# Patient Record
Sex: Female | Born: 2005 | Race: White | Hispanic: No | Marital: Single | Smoking: Never smoker
Health system: Southern US, Community
[De-identification: ages and names within clinical notes are randomized; demographics above are authoritative.]

---
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
## 2023-02-22 DIAGNOSIS — M25362 Other instability, left knee: Secondary | ICD-10-CM | POA: Diagnosis not present

## 2023-03-07 DIAGNOSIS — M23352 Other meniscus derangements, posterior horn of lateral meniscus, left knee: Secondary | ICD-10-CM | POA: Diagnosis not present

## 2023-03-07 DIAGNOSIS — M23612 Other spontaneous disruption of anterior cruciate ligament of left knee: Secondary | ICD-10-CM | POA: Diagnosis not present

## 2023-03-07 DIAGNOSIS — M25362 Other instability, left knee: Secondary | ICD-10-CM | POA: Diagnosis not present

## 2023-03-07 DIAGNOSIS — M84462A Pathological fracture, left tibia, initial encounter for fracture: Secondary | ICD-10-CM | POA: Diagnosis not present

## 2023-03-07 DIAGNOSIS — M94262 Chondromalacia, left knee: Secondary | ICD-10-CM | POA: Diagnosis not present

## 2023-03-07 DIAGNOSIS — M23322 Other meniscus derangements, posterior horn of medial meniscus, left knee: Secondary | ICD-10-CM | POA: Diagnosis not present

## 2023-03-07 DIAGNOSIS — M1712 Unilateral primary osteoarthritis, left knee: Secondary | ICD-10-CM | POA: Diagnosis not present

## 2023-03-07 DIAGNOSIS — M84452A Pathological fracture, left femur, initial encounter for fracture: Secondary | ICD-10-CM | POA: Diagnosis not present

## 2023-03-18 DIAGNOSIS — S83512A Sprain of anterior cruciate ligament of left knee, initial encounter: Secondary | ICD-10-CM | POA: Diagnosis not present

## 2023-03-18 DIAGNOSIS — S83282A Other tear of lateral meniscus, current injury, left knee, initial encounter: Secondary | ICD-10-CM | POA: Diagnosis not present

## 2023-03-18 DIAGNOSIS — S83242A Other tear of medial meniscus, current injury, left knee, initial encounter: Secondary | ICD-10-CM | POA: Diagnosis not present

## 2023-06-02 ENCOUNTER — Other Ambulatory Visit: Payer: Self-pay

## 2023-06-02 ENCOUNTER — Emergency Department
Admission: EM | Admit: 2023-06-02 | Discharge: 2023-06-02 | Disposition: A | Discharge status: Home / Self Care | Payer: Medicaid Other | Attending: Student | Admitting: Student

## 2023-06-02 DIAGNOSIS — J028 Acute pharyngitis due to other specified organisms: Secondary | ICD-10-CM

## 2023-06-02 DIAGNOSIS — J029 Acute pharyngitis, unspecified: Secondary | ICD-10-CM | POA: Diagnosis not present

## 2023-06-02 DIAGNOSIS — R509 Fever, unspecified: Secondary | ICD-10-CM | POA: Insufficient documentation

## 2023-06-02 DIAGNOSIS — R0981 Nasal congestion: Secondary | ICD-10-CM | POA: Diagnosis not present

## 2023-06-02 LAB — POC URINE PREG, ED: Preg Test, Ur: NEGATIVE

## 2023-06-02 MED ORDER — IBUPROFEN 600 MG PO TABS
600.0000 mg | ORAL_TABLET | Freq: Once | ORAL | Status: AC
  Administered 2023-06-02: 600 mg via ORAL
  Filled 2023-06-02: qty 1

## 2023-06-02 MED ORDER — AMOXICILLIN 500 MG PO CAPS: 500.0000 mg | ORAL_CAPSULE | Freq: Three times a day (TID) | ORAL | 0 refills | Status: AC

## 2023-06-02 MED ORDER — IBUPROFEN 600 MG PO TABS: 600.0000 mg | ORAL_TABLET | Freq: Three times a day (TID) | ORAL | 0 refills | Status: AC | PRN

## 2023-06-02 NOTE — ED Notes (Signed)
 Pt states that she has a sore throat with white spots visible on same. Pt states she had a negative strep test at [facility] today, however she wants another test performed here. Pt also states she has missed her period and is requesting a pregnancy test as well.

## 2023-06-02 NOTE — ED Provider Notes (Signed)
 [facility] Provider Note    None    (approximate)   History   Sore Throat   HPI  Rhonda Wagner is a 18 y.o. female presents today with history of 5 days of sore throat, fever, nauseas.  Patient went for urgent care without getting any treatment.     Physical Exam   Triage Vital Signs: ED Triage Vitals  Encounter Vitals Group     BP 06/02/23 1814 110/84     Systolic BP Percentile --      Diastolic BP Percentile --      Pulse Rate 06/02/23 1814 98     Resp 06/02/23 1814 18     Temp 06/02/23 1814 97.9 F (36.6 C)     Temp Source 06/02/23 1814 Oral     SpO2 06/02/23 1814 99 %     Weight --      Height --      Head Circumference --      Peak Flow --      Pain Score 06/02/23 1812 5     Pain Loc --      Pain Education --      Exclude from Growth Chart --     Most recent vital signs: Vitals:   06/02/23 1814 06/02/23 1930  BP: 110/84 115/72  Pulse: 98 91  Resp: 18 18  Temp: 97.9 F (36.6 C)   SpO2: 99% 100%     Constitutional: Alert mild distress Eyes: Conjunctivae are normal.  Head: Atraumatic. Nose: No congestion/rhinnorhea. Mouth/Throat: Mucous membranes are moist.  Peritonsillar erythema, white secretions, elongated tonsils.  Muffled voice Neck: Painless ROM.  Cardiovascular:   Good peripheral circulation. Respiratory: Normal respiratory effort.  No retractions.  Gastrointestinal: Soft and nontender.  Musculoskeletal:  no deformity Neurologic:  MAE spontaneously. No gross focal neurologic deficits are appreciated.  Skin:  Skin is warm, dry and intact. No rash noted. Psychiatric: Mood and affect are normal. Speech and behavior are normal.    ED Results / Procedures / Treatments   Labs (all labs ordered are listed, but only abnormal results are displayed) Labs Reviewed  POC URINE PREG, ED - Normal     EKG    RADIOLOGY     PROCEDURES:  Critical Care performed:   Procedures   MEDICATIONS ORDERED IN  ED: Medications  ibuprofen (ADVIL) tablet 600 mg (has no administration in time range)     IMPRESSION / MDM / ASSESSMENT AND PLAN / ED COURSE  I reviewed the triage vital signs and the nursing notes.  Differential diagnosis includes, but is not limited to, pharyngoamygdalitis   Influenza. Covid  Patient's presentation is most consistent with acute complicated illness / injury requiring diagnostic workup.   Patient's diagnosis is consistent with pharyngoamygdalitis .  I did review the patient's allergies and medications. Patient will be discharged home with prescriptions for amoxicillin, ibuprofen. Patient is to follow up with  PCP as needed or otherwise directed. Patient is given ED precautions to return to the ED for any worsening or new symptoms. Discussed plan of care with patient, answered all of patient's questions, Patient agreeable to plan of care. Advised patient to take medications according to the instructions on the label. Discussed possible side effects of new medications. Patient verbalized understanding.     FINAL CLINICAL IMPRESSION(S) / ED DIAGNOSES   Final diagnoses:  Pharyngitis due to other organism     Rx / DC Orders   ED Discharge Orders  Ordered    amoxicillin (AMOXIL) 500 MG capsule  3 times daily        06/02/23 2006    ibuprofen (ADVIL) 600 MG tablet  Every 8 hours PRN        06/02/23 2006             Note:  This document was prepared using Dragon voice recognition software and may include unintentional dictation errors.   Gladys Damme, PA-C 06/02/23 Tyrell Antonio, MD 06/02/23 [phone]

## 2023-06-02 NOTE — ED Triage Notes (Addendum)
 Pt comes with 3 days of sore throat. Pt states white patches in back of throat. Pt denies any other symptoms. Pt did have swab done at [facility] today and it was negative for strep.

## 2023-06-02 NOTE — Discharge Instructions (Addendum)
 You have been diagnosed with pharyngitis, please take amoxicillin 1 capsule by mouth 3 times per day for 7 days.  Please take ibuprofen 1 tablet by mouth every 8 hours as needed for pain.  Please drink plenty fluids to prevent dehydration.  Please come back to ED or go to your PCP if you have new symptoms and symptoms worsen.

## 2023-06-03 ENCOUNTER — Ambulatory Visit (INDEPENDENT_AMBULATORY_CARE_PROVIDER_SITE_OTHER): Payer: Medicaid Other

## 2023-06-03 VITALS — BP 94/64 | HR 76 | Ht 64.0 in | Wt 226.8 lb

## 2023-06-03 DIAGNOSIS — N912 Amenorrhea, unspecified: Secondary | ICD-10-CM

## 2023-06-03 NOTE — Patient Instructions (Signed)
 Primary Amenorrhea Primary amenorrhea is when a female has not started having periods by the time she is 20 years old. What are the causes? This condition may be caused by: An abnormal chromosome that causes the ovaries not to work properly. This is a common cause. Polycystic ovary syndrome. Being born without a vagina, uterus, or ovaries. Cystic fibrosis. Pituitary gland tumor in the brain. Long-term illnesses. Cushing disease. A thyroid disease, such as hypothyroidism or hyperthyroidism. A part of the brain called the hypothalamus not working normally. Premature ovarian failure. Other causes of this condition include: Malnutrition. Extreme obesity. Low blood sugar (hypoglycemia). Drastic weight loss. Overexercising that leads to a loss of body fat. What are the signs or symptoms? The main symptom of this condition is a female not having had a period by 20 years of age. Other symptoms may include: Discharge from the breasts. Hot flashes. Adult acne. Facial or chest hair. Headaches. Impaired vision. Recent excessive stress. Changes in weight, diet, or exercise patterns. How is this diagnosed? This condition may be diagnosed based on: Your medical history. A physical exam. Blood tests. Urine tests. You may also have other tests, including: Ultrasound of the pelvis. MRI of the head. How is this treated? Treatment for this condition depends on the cause. Treatment may include lifestyle changes, surgery, or medicine. Follow these instructions at home:     Eat a healthy diet. A healthy diet includes lots of fruits and vegetables, low-fat dairy products, lean meats, and foods that contain fiber. Maintain a healthy weight. Talk to your health care provider before trying any new diet or exercise plan Get regular exercise. Aim for 30 minutes of moderate-intensity activity 5 times a week. Examples of moderate-intensity activity include walking and yoga. Be sure to talk with your  health care provider before starting any exercise routine. Take over-the-counter and prescription medicines only as told by your health care provider. Keep all follow-up visits. This is important. Contact a health care provider if: You have pelvic pain. You gain an unusual amount of weight. You have an unusual amount of hair growth. Summary Primary amenorrhea is when a female has not started having periods by the time she is 20 years old. This condition has many causes, including abnormal ovaries, obesity, extreme weight loss. Contact a health care provider if you have pelvic pain, gain an unusual amount of weight, or have an unusual amount of hair growth. This information is not intended to replace advice given to you by your health care provider. Make sure you discuss any questions you have with your health care provider. Document Revised: 11/10/2019 Document Reviewed: 11/10/2019 Elsevier Patient Education  2024 [employer].

## 2023-06-03 NOTE — Progress Notes (Signed)
    NURSE VISIT NOTE  Subjective:    Patient ID: Rhonda Wagner, female    DOB: Mar 07, 2006, 18 y.o.   MRN: [id number]  HPI  Patient is a 18 y.o. G0P0000 female who presents for evaluation of amenorrhea. She believes she could be pregnant. Sexual Activity: single partner, contraception: none. Current symptoms also include: nausea. Last period was normal. She has had four negative home pregnancy tests and a negative urine pregnancy at urgent care/ED yesterday.    Objective:    BP 94/64   Pulse 76   Ht 5\' 4"  (1.626 m)   Wt 226 lb 12.8 oz (102.9 kg)   LMP 04/26/2023 (Exact Date)   BMI 38.93 kg/m   Lab Review  No urine pregnancy done today with negative result in chart 06/02/23  Assessment:   1. Amenorrhea     Plan:    Pregnancy Test: @PLANEND @Negative   Beta Quant Ordered per  Hildred Laser, MD. Patient will check my chart for results. If <5 (non pregnant) schedule appointment to discuss birth control. If >5 (pregnant) contact office to schedule Ob appointments.   Rocco Serene, LPN

## 2023-06-04 ENCOUNTER — Encounter: Payer: Self-pay | Admitting: Obstetrics and Gynecology

## 2023-06-04 LAB — BETA HCG QUANT (REF LAB): hCG Quant: <1 m[IU]/mL

## 2023-10-01 ENCOUNTER — Encounter: Payer: Self-pay | Admitting: Obstetrics

## 2023-10-01 ENCOUNTER — Ambulatory Visit (INDEPENDENT_AMBULATORY_CARE_PROVIDER_SITE_OTHER)

## 2023-10-01 ENCOUNTER — Other Ambulatory Visit (HOSPITAL_COMMUNITY)
Admission: RE | Admit: 2023-10-01 | Discharge: 2023-10-01 | Disposition: A | Discharge status: Home / Self Care | Source: Ambulatory Visit | Attending: Obstetrics | Admitting: Obstetrics

## 2023-10-01 VITALS — BP 122/73 | HR 71 | Wt 216.3 lb

## 2023-10-01 DIAGNOSIS — N898 Other specified noninflammatory disorders of vagina: Secondary | ICD-10-CM | POA: Diagnosis present

## 2023-10-01 DIAGNOSIS — Z113 Encounter for screening for infections with a predominantly sexual mode of transmission: Secondary | ICD-10-CM | POA: Diagnosis present

## 2023-10-01 NOTE — Progress Notes (Signed)
    NURSE VISIT NOTE  Subjective:    Patient ID: Rhonda Wagner, female    DOB: 02/09/2006, 18 y.o.   MRN: [id number]  HPI  Patient is a 18 y.o. G0P0000 female who presents for white vaginal discharge for 2 day(s). Denies abnormal vaginal bleeding or significant pelvic pain or fever.  Patient denies history of known exposure to STD. Patient reports her boyfriend tested positive for a STD.    Objective:    BP 122/73 (BP Location: Left Arm, Patient Position: Sitting, Cuff Size: Normal)   Pulse 71   Wt 216 lb 4.8 oz (98.1 kg)   BMI 37.13 kg/m    @THIS  VISIT ONLY@  Assessment:   1. Screening examination for STD (sexually transmitted disease)   2. Vaginal discharge      Plan:   GC and chlamydia DNA  probe sent to lab. Treatment: abstain from coitus during course of treatment ROV prn if symptoms persist or worsen.   Burnard LITTIE Ro, CMA

## 2023-10-02 LAB — CERVICOVAGINAL ANCILLARY ONLY
Bacterial Vaginitis (gardnerella): POSITIVE — AB
Candida Glabrata: NEGATIVE
Candida Vaginitis: POSITIVE — AB
Chlamydia: NEGATIVE
Comment: NEGATIVE
Comment: NEGATIVE
Comment: NEGATIVE
Comment: NEGATIVE
Comment: NEGATIVE
Comment: NORMAL
Neisseria Gonorrhea: NEGATIVE
Trichomonas: POSITIVE — AB

## 2023-10-03 ENCOUNTER — Ambulatory Visit: Payer: Self-pay

## 2023-10-03 DIAGNOSIS — B9689 Other specified bacterial agents as the cause of diseases classified elsewhere: Secondary | ICD-10-CM

## 2023-10-03 DIAGNOSIS — A599 Trichomoniasis, unspecified: Secondary | ICD-10-CM

## 2023-10-03 DIAGNOSIS — B379 Candidiasis, unspecified: Secondary | ICD-10-CM

## 2023-10-03 MED ORDER — FLUCONAZOLE 150 MG PO TABS
150.0000 mg | ORAL_TABLET | Freq: Every day | ORAL | 0 refills | Status: AC
Start: 2023-10-03 — End: ?

## 2023-10-03 MED ORDER — METRONIDAZOLE 500 MG PO TABS
500.0000 mg | ORAL_TABLET | Freq: Two times a day (BID) | ORAL | 0 refills | Status: AC
Start: 2023-10-03 — End: ?

## 2023-11-18 ENCOUNTER — Ambulatory Visit

## 2023-11-20 DIAGNOSIS — H5213 Myopia, bilateral: Secondary | ICD-10-CM | POA: Diagnosis not present

## 2024-02-12 DIAGNOSIS — Z03818 Encounter for observation for suspected exposure to other biological agents ruled out: Secondary | ICD-10-CM | POA: Diagnosis not present

## 2024-02-12 DIAGNOSIS — J028 Acute pharyngitis due to other specified organisms: Secondary | ICD-10-CM | POA: Diagnosis not present

## 2024-02-12 DIAGNOSIS — J069 Acute upper respiratory infection, unspecified: Secondary | ICD-10-CM | POA: Diagnosis not present

## 2024-03-26 ENCOUNTER — Ambulatory Visit

## 2024-03-26 ENCOUNTER — Other Ambulatory Visit (HOSPITAL_COMMUNITY)
Admission: RE | Admit: 2024-03-26 | Discharge: 2024-03-26 | Disposition: A | Discharge status: Home / Self Care | Source: Ambulatory Visit | Attending: Registered Nurse | Admitting: Registered Nurse

## 2024-03-26 VITALS — BP 122/84 | HR 84 | Wt 217.0 lb

## 2024-03-26 DIAGNOSIS — N390 Urinary tract infection, site not specified: Secondary | ICD-10-CM

## 2024-03-26 DIAGNOSIS — N898 Other specified noninflammatory disorders of vagina: Secondary | ICD-10-CM | POA: Diagnosis present

## 2024-03-26 DIAGNOSIS — R300 Dysuria: Secondary | ICD-10-CM

## 2024-03-26 LAB — POCT URINALYSIS DIPSTICK
Bilirubin, UA: NEGATIVE
Blood, UA: NEGATIVE
Glucose, UA: NEGATIVE
Ketones, UA: NEGATIVE
Protein, UA: POSITIVE — AB
Spec Grav, UA: 1.025
Urobilinogen, UA: 0.2 U/dL
pH, UA: 5

## 2024-03-26 MED ORDER — SULFAMETHOXAZOLE-TRIMETHOPRIM 800-160 MG PO TABS: 1.0000 | ORAL_TABLET | Freq: Two times a day (BID) | ORAL | 0 refills | Status: DC

## 2024-03-26 NOTE — Progress Notes (Signed)
" ° ° °  NURSE VISIT NOTE  Subjective:    Patient ID: Rhonda Wagner, female    DOB: 09-16-2005, 18 y.o.   MRN: [id number]  HPI  Patient is a 18 y.o. G0P0000 female who is here for burning with urination and green vaginal discharge.  She says she had intercourse with her boyfriend 4 days ago.  The next day her vaginal discharge was yellowish/green and lumpy with a foul odor and slight itching.  She also is experiencing burning with urination.  She denies any pelvic or flank pain or fevers.  She says this partner has given her trichomonas in the past.      Objective:    BP 122/84   Pulse 84   Wt 217 lb (98.4 kg)   BMI 37.25 kg/m    Results for orders placed or performed in visit on 03/26/24  POCT Urinalysis Dipstick  Result Value Ref Range   Color, UA     Clarity, UA     Glucose, UA Negative Negative   Bilirubin, UA Negative    Ketones, UA Negative    Spec Grav, UA 1.025 1.010 - 1.025   Blood, UA Negative    pH, UA 5.0 5.0 - 8.0   Protein, UA Positive (A) Negative   Urobilinogen, UA 0.2 0.2 or 1.0 E.U./dL   Nitrite, UA +    Leukocytes, UA Moderate (2+) (A) Negative   Appearance cloudy    Odor      Assessment:   1. Urinary tract infection without hematuria, site unspecified   2. Vaginal discharge   3. Dysuria       Plan:   GC and chlamydia DNA  probe sent to lab.  Clinic will call with abnormal results.  UA positive for leukocytes, nitrites and protein.  Urine sent for culture.    Bactrim DS po bid x 7 days sent to [facility] in [place].    Pt encouraged to increase fluids and complete abx course.  Advised we will call if culture indicates she needs an alternate abx.    ROV prn if symptoms persist or worsen.   Rollo FORBES Louder, RN  "

## 2024-03-28 ENCOUNTER — Ambulatory Visit: Payer: Self-pay | Admitting: Registered Nurse

## 2024-03-28 LAB — URINE CULTURE

## 2024-03-28 MED ORDER — PENICILLIN V POTASSIUM 500 MG PO TABS: 500.0000 mg | ORAL_TABLET | Freq: Four times a day (QID) | ORAL | 0 refills | Status: AC

## 2024-03-30 ENCOUNTER — Other Ambulatory Visit: Payer: Self-pay

## 2024-03-30 DIAGNOSIS — B9689 Other specified bacterial agents as the cause of diseases classified elsewhere: Secondary | ICD-10-CM

## 2024-03-30 DIAGNOSIS — B379 Candidiasis, unspecified: Secondary | ICD-10-CM

## 2024-03-30 LAB — CERVICOVAGINAL ANCILLARY ONLY
Bacterial Vaginitis (gardnerella): POSITIVE — AB
Candida Glabrata: NEGATIVE
Candida Vaginitis: POSITIVE — AB
Chlamydia: NEGATIVE
Comment: NEGATIVE
Comment: NEGATIVE
Comment: NEGATIVE
Comment: NEGATIVE
Comment: NEGATIVE
Comment: NORMAL
Neisseria Gonorrhea: NEGATIVE
Trichomonas: NEGATIVE

## 2024-03-30 MED ORDER — METRONIDAZOLE 500 MG PO TABS: 500.0000 mg | ORAL_TABLET | Freq: Two times a day (BID) | ORAL | 0 refills | Status: AC

## 2024-03-30 MED ORDER — FLUCONAZOLE 150 MG PO TABS: 150.0000 mg | ORAL_TABLET | Freq: Once | ORAL | 0 refills | Status: AC

## 2024-04-13 ENCOUNTER — Other Ambulatory Visit: Payer: Self-pay

## 2024-04-13 ENCOUNTER — Encounter: Payer: Self-pay | Admitting: Emergency Medicine

## 2024-04-13 ENCOUNTER — Emergency Department
Admission: EM | Admit: 2024-04-13 | Discharge: 2024-04-13 | Disposition: A | Discharge status: Home / Self Care | Attending: Emergency Medicine | Admitting: Emergency Medicine

## 2024-04-13 DIAGNOSIS — R1010 Upper abdominal pain, unspecified: Secondary | ICD-10-CM | POA: Diagnosis present

## 2024-04-13 DIAGNOSIS — N390 Urinary tract infection, site not specified: Secondary | ICD-10-CM | POA: Diagnosis not present

## 2024-04-13 DIAGNOSIS — R1084 Generalized abdominal pain: Secondary | ICD-10-CM

## 2024-04-13 LAB — COMPREHENSIVE METABOLIC PANEL WITH GFR
ALT: 18 U/L (ref 0–44)
AST: 18 U/L (ref 15–41)
Albumin: 3.9 g/dL (ref 3.5–5.0)
Alkaline Phosphatase: 69 U/L (ref 38–126)
Anion gap: 11 (ref 5–15)
BUN: 8 mg/dL (ref 6–20)
CO2: 25 mmol/L (ref 22–32)
Calcium: 8.6 mg/dL — ABNORMAL LOW (ref 8.9–10.3)
Chloride: 103 mmol/L (ref 98–111)
Creatinine, Ser: 0.75 mg/dL (ref 0.44–1.00)
GFR, Estimated: >60 mL/min
Glucose, Bld: 104 mg/dL — ABNORMAL HIGH (ref 70–99)
Potassium: 3.5 mmol/L (ref 3.5–5.1)
Sodium: 139 mmol/L (ref 135–145)
Total Bilirubin: 0.4 mg/dL (ref 0.0–1.2)
Total Protein: 6.8 g/dL (ref 6.5–8.1)

## 2024-04-13 LAB — POC URINE PREG, ED: Preg Test, Ur: NEGATIVE

## 2024-04-13 LAB — CBC
HCT: 37.4 % (ref 36.0–46.0)
Hemoglobin: 12.6 g/dL (ref 12.0–15.0)
MCH: 29 pg (ref 26.0–34.0)
MCHC: 33.7 g/dL (ref 30.0–36.0)
MCV: 86.2 fL (ref 80.0–100.0)
Platelets: 270 K/uL (ref 150–400)
RBC: 4.34 MIL/uL (ref 3.87–5.11)
RDW: 12.5 % (ref 11.5–15.5)
WBC: 9.9 K/uL (ref 4.0–10.5)
nRBC: 0 % (ref 0.0–0.2)

## 2024-04-13 LAB — URINALYSIS, ROUTINE W REFLEX MICROSCOPIC
Bacteria, UA: NONE SEEN
Bilirubin Urine: NEGATIVE
Glucose, UA: NEGATIVE mg/dL
Hgb urine dipstick: NEGATIVE
Ketones, ur: NEGATIVE mg/dL
Nitrite: NEGATIVE
Protein, ur: NEGATIVE mg/dL
Specific Gravity, Urine: 1.026 (ref 1.005–1.030)
pH: 5 (ref 5.0–8.0)

## 2024-04-13 LAB — LIPASE, BLOOD: Lipase: 23 U/L (ref 11–51)

## 2024-04-13 MED ORDER — ONDANSETRON 4 MG PO TBDP: 4.0000 mg | ORAL_TABLET | Freq: Four times a day (QID) | ORAL | 0 refills | Status: AC | PRN

## 2024-04-13 MED ORDER — CEPHALEXIN 500 MG PO CAPS: 500.0000 mg | ORAL_CAPSULE | Freq: Two times a day (BID) | ORAL | 0 refills | Status: AC

## 2024-04-13 MED ORDER — CEPHALEXIN 500 MG PO CAPS
500.0000 mg | ORAL_CAPSULE | Freq: Once | ORAL | Status: AC
  Administered 2024-04-13: 500 mg via ORAL
  Filled 2024-04-13: qty 1

## 2024-04-13 NOTE — ED Triage Notes (Signed)
 Pt arrives POV ambulatory to triage, gait steady, no acute distress noted c/o upper/mid abd pain that radiates to flanks w/ nausea x 1 week. Lmp 12/24

## 2024-04-13 NOTE — ED Notes (Signed)
(  2) green and (1) Lav top tube collected, labeled, and sent to lab

## 2024-04-13 NOTE — Discharge Instructions (Addendum)
 You may alternate over the counter Tylenol 1000 mg every 6 hours as needed for pain, fever and Ibuprofen 800 mg every 6-8 hours as needed for pain, fever.  Please take Ibuprofen with food.  Do not take more than 4000 mg of Tylenol (acetaminophen) in a 24 hour period.

## 2024-04-13 NOTE — ED Provider Notes (Signed)
 "  [facility] Provider Note    Event Date/Time   First MD Initiated Contact with Patient 04/13/24 2314     (approximate)   History   Abdominal Pain   HPI  Rhonda Wagner is a 19 y.o. female with no significant past medical history who presents to the emergency department with abdominal pain. Upper abd pain x 1 week N/v No diarrhea No GU sx other than dysuria No abd surgeries Never had before No A/A factors    History provided by patient and significant other.    History reviewed. No pertinent past medical history.  History reviewed. No pertinent surgical history.  MEDICATIONS:  Prior to Admission medications  Medication Sig Start Date End Date Taking? Authorizing Provider  fluconazole  (DIFLUCAN ) 150 MG tablet Take 1 tablet (150 mg total) by mouth daily. Patient not taking: Reported on 03/26/2024 10/03/23   Leigh Sober, MD  metroNIDAZOLE  (FLAGYL ) 500 MG tablet Take 1 tablet (500 mg total) by mouth 2 (two) times daily. Patient not taking: Reported on 03/26/2024 10/03/23   Leigh Sober, MD  metroNIDAZOLE  (FLAGYL ) 500 MG tablet Take 1 tablet (500 mg total) by mouth 2 (two) times daily. 03/30/24   Sebastian Sham, CNM  Norethindrone Acetate-Ethinyl Estrad-FE (HAILEY  24 FE) 1-20 MG-MCG(24) tablet Take 1 tablet by mouth daily. Patient not taking: Reported on 03/26/2024 08/21/22   Justino Eleanor HERO, CNM    Physical Exam   Triage Vital Signs: ED Triage Vitals  Encounter Vitals Group     BP 04/13/24 1912 122/76     Girls Systolic BP Percentile --      Girls Diastolic BP Percentile --      Boys Systolic BP Percentile --      Boys Diastolic BP Percentile --      Pulse Rate 04/13/24 1912 91     Resp 04/13/24 1912 18     Temp 04/13/24 1912 98.4 F (36.9 C)     Temp Source 04/13/24 1912 Oral     SpO2 04/13/24 1912 100 %     Weight --      Height --      Head Circumference --      Peak Flow --      Pain Score 04/13/24 1911 5     Pain Loc --       Pain Education --      Exclude from Growth Chart --     Most recent vital signs: Vitals:   04/13/24 1912 04/13/24 2348  BP: 122/76 103/74  Pulse: 91 73  Resp: 18 15  Temp: 98.4 F (36.9 C) 97.9 F (36.6 C)  SpO2: 100% 100%    CONSTITUTIONAL: Alert, responds appropriately to questions. Well-appearing; well-nourished HEAD: Normocephalic, atraumatic EYES: Conjunctivae clear, pupils appear equal, sclera nonicteric ENT: normal nose; moist mucous membranes NECK: Supple, normal ROM CARD: RRR; S1 and S2 appreciated RESP: Normal chest excursion without splinting or tachypnea; breath sounds clear and equal bilaterally; no wheezes, no rhonchi, no rales, no hypoxia or respiratory distress, speaking full sentences ABD/GI: Non-distended; soft, tender diffusely, no guarding or rebound, negative Murphy sign BACK: The back appears normal EXT: Normal ROM in all joints; no deformity noted, no edema SKIN: Normal color for age and race; warm; no rash on exposed skin NEURO: Moves all extremities equally, normal speech PSYCH: The patient's mood and manner are appropriate.   ED Results / Procedures / Treatments   LABS: (all labs ordered are listed, but only abnormal results are displayed)  Labs Reviewed  COMPREHENSIVE METABOLIC PANEL WITH GFR - Abnormal; Notable for the following components:      Result Value   Glucose, Bld 104 (*)    Calcium 8.6 (*)    All other components within normal limits  URINALYSIS, ROUTINE W REFLEX MICROSCOPIC - Abnormal; Notable for the following components:   Color, Urine YELLOW (*)    APPearance CLOUDY (*)    Leukocytes,Ua SMALL (*)    All other components within normal limits  URINE CULTURE  LIPASE, BLOOD  CBC  POC URINE PREG, ED     EKG:  EKG Interpretation Date/Time:    Ventricular Rate:    PR Interval:    QRS Duration:    QT Interval:    QTC Calculation:   R Axis:      Text Interpretation:           RADIOLOGY: My personal review and  interpretation of imaging:    I have personally reviewed all radiology reports.   No results found.   PROCEDURES:  Critical Care performed: No   CRITICAL CARE Performed by: Josette Sink   Total critical care time: 0 minutes  Critical care time was exclusive of separately billable procedures and treating other patients.  Critical care was necessary to treat or prevent imminent or life-threatening deterioration.  Critical care was time spent personally by me on the following activities: development of treatment plan with patient and/or surrogate as well as nursing, discussions with consultants, evaluation of patient's response to treatment, examination of patient, obtaining history from patient or surrogate, ordering and performing treatments and interventions, ordering and review of laboratory studies, ordering and review of radiographic studies, pulse oximetry and re-evaluation of patient's condition.   Procedures    IMPRESSION / MDM / ASSESSMENT AND PLAN / ED COURSE  I reviewed the triage vital signs and the nursing notes.    Patient here with upper abdominal pain, vomiting.     DIFFERENTIAL DIAGNOSIS (includes but not limited to):   Viral gastroenteritis, GERD, gastritis, H. pylori, cholelithiasis, cholecystitis, pancreatitis, UTI, less likely appendicitis, kidney stone, pyelonephritis   Patient's presentation is most consistent with acute presentation with potential threat to life or bodily function.   PLAN: Patient's labs show no leukocytosis.  Normal LFTs and lipase.  Urine shows red blood cells and white blood cells.  Will add on urine culture.  No bacteria but given she is having dysuria, will treat with Keflex .  Given upper abdominal pain with vomiting I have recommended ultrasound to further evaluate her gallbladder.  Patient declines at this time stating that she wants to go home.  Discussed that my suspicion for cholecystitis or appendicitis is low given no  fever, leukocytosis and symptoms ongoing for a week but cannot rule out other emergent pathology without imaging.  She verbalized understanding but states that she still wants to leave.  Will place PCP referral and discussed return precautions.  Will discharge with antibiotics for UTI.   MEDICATIONS GIVEN IN ED: Medications  cephALEXin  (KEFLEX ) capsule 500 mg (500 mg Oral Given 04/13/24 2348)     ED COURSE:  At this time, I do not feel there is any life-threatening condition present. I reviewed all nursing notes, vitals, pertinent previous records.  All lab and urine results, EKGs, imaging ordered have been independently reviewed and interpreted by myself.  I reviewed all available radiology reports from any imaging ordered this visit.  Based on my assessment, I feel the patient is safe to be discharged  home without further emergent workup and can continue workup as an outpatient as needed. Discussed all findings, treatment plan as well as usual and customary return precautions.  They verbalize understanding and are comfortable with this plan.  Outpatient follow-up has been provided as needed.  All questions have been answered.    CONSULTS:  none   OUTSIDE RECORDS REVIEWED: Reviewed last internal medicine panel 02/12/2024.       FINAL CLINICAL IMPRESSION(S) / ED DIAGNOSES   Final diagnoses:  Generalized abdominal pain  Acute UTI     Rx / DC Orders   ED Discharge Orders          Ordered    cephALEXin  (KEFLEX ) 500 MG capsule  2 times daily        04/13/24 2345    ondansetron  (ZOFRAN -ODT) 4 MG disintegrating tablet  Every 6 hours PRN        04/13/24 2345    Ambulatory Referral to Primary Care (Establish Care)        04/13/24 2345             Note:  This document was prepared using Dragon voice recognition software and may include unintentional dictation errors.   Ardis Lawley, Josette SAILOR, DO 04/14/24 [phone]  "

## 2024-04-15 LAB — URINE CULTURE: Culture: NO GROWTH
